# Patient Record
Sex: Male | Born: 1980 | Race: White | Hispanic: No | Marital: Married | Smoking: Current some day smoker
Health system: Southern US, Community
[De-identification: ages and names within clinical notes are randomized; demographics above are authoritative.]

## PROBLEM LIST (undated history)

## (undated) DIAGNOSIS — N289 Disorder of kidney and ureter, unspecified: Secondary | ICD-10-CM

## (undated) DIAGNOSIS — Q249 Congenital malformation of heart, unspecified: Secondary | ICD-10-CM

## (undated) DIAGNOSIS — G4730 Sleep apnea, unspecified: Secondary | ICD-10-CM

## (undated) HISTORY — PX: NO PAST SURGERIES: SHX2092

---

## 2008-04-21 ENCOUNTER — Emergency Department: Payer: Self-pay | Admitting: Emergency Medicine

## 2021-09-22 ENCOUNTER — Other Ambulatory Visit: Payer: Self-pay | Admitting: Family Medicine

## 2021-09-22 DIAGNOSIS — R1011 Right upper quadrant pain: Secondary | ICD-10-CM

## 2021-09-22 DIAGNOSIS — R1013 Epigastric pain: Secondary | ICD-10-CM

## 2021-10-01 ENCOUNTER — Ambulatory Visit
Admission: RE | Admit: 2021-10-01 | Discharge: 2021-10-01 | Disposition: A | Discharge status: Home / Self Care | Payer: 59 | Source: Ambulatory Visit | Attending: Family Medicine | Admitting: Family Medicine

## 2021-10-01 DIAGNOSIS — R1011 Right upper quadrant pain: Secondary | ICD-10-CM | POA: Insufficient documentation

## 2021-10-01 DIAGNOSIS — R1013 Epigastric pain: Secondary | ICD-10-CM | POA: Diagnosis present

## 2021-11-26 ENCOUNTER — Other Ambulatory Visit: Payer: Self-pay | Admitting: Gastroenterology

## 2021-11-26 DIAGNOSIS — R110 Nausea: Secondary | ICD-10-CM

## 2021-11-26 DIAGNOSIS — R1013 Epigastric pain: Secondary | ICD-10-CM

## 2021-12-03 ENCOUNTER — Ambulatory Visit
Admission: RE | Admit: 2021-12-03 | Discharge: 2021-12-03 | Disposition: A | Discharge status: Home / Self Care | Payer: 59 | Source: Ambulatory Visit | Attending: Gastroenterology | Admitting: Gastroenterology

## 2021-12-03 ENCOUNTER — Other Ambulatory Visit: Payer: Self-pay | Admitting: Gastroenterology

## 2021-12-03 DIAGNOSIS — R1013 Epigastric pain: Secondary | ICD-10-CM

## 2021-12-03 DIAGNOSIS — R110 Nausea: Secondary | ICD-10-CM | POA: Insufficient documentation

## 2022-11-17 ENCOUNTER — Emergency Department (HOSPITAL_COMMUNITY): Payer: 59

## 2022-11-17 ENCOUNTER — Other Ambulatory Visit: Payer: Self-pay

## 2022-11-17 ENCOUNTER — Emergency Department (HOSPITAL_COMMUNITY)
Admission: EM | Admit: 2022-11-17 | Discharge: 2022-11-18 | Disposition: A | Discharge status: Home / Self Care | Payer: 59 | Attending: Emergency Medicine | Admitting: Emergency Medicine

## 2022-11-17 ENCOUNTER — Telehealth: Payer: 59 | Admitting: Physician Assistant

## 2022-11-17 ENCOUNTER — Encounter (HOSPITAL_COMMUNITY): Payer: Self-pay

## 2022-11-17 DIAGNOSIS — N50812 Left testicular pain: Secondary | ICD-10-CM | POA: Diagnosis present

## 2022-11-17 DIAGNOSIS — R109 Unspecified abdominal pain: Secondary | ICD-10-CM | POA: Insufficient documentation

## 2022-11-17 DIAGNOSIS — Z1152 Encounter for screening for COVID-19: Secondary | ICD-10-CM | POA: Diagnosis not present

## 2022-11-17 DIAGNOSIS — N451 Epididymitis: Secondary | ICD-10-CM

## 2022-11-17 DIAGNOSIS — N12 Tubulo-interstitial nephritis, not specified as acute or chronic: Secondary | ICD-10-CM

## 2022-11-17 HISTORY — DX: Disorder of kidney and ureter, unspecified: N28.9

## 2022-11-17 HISTORY — DX: Congenital malformation of heart, unspecified: Q24.9

## 2022-11-17 HISTORY — DX: Sleep apnea, unspecified: G47.30

## 2022-11-17 NOTE — ED Triage Notes (Signed)
Having ongoing bilateral testicular pain since January worsening over the past week. Saw PCP yesterday. Dx with epididymitis. Treated yesterday with 1 gm Rocephin IM and placed on oral Doxycycline. No improvement today. Started having left sided back/flank pain this afternoon.  Denies fever and some burning with urination.

## 2022-11-17 NOTE — Progress Notes (Signed)
Based on what you shared with me, I feel your condition warrants further evaluation as soon as possible at an Emergency department.  I am very concerned that infection has tracked up your urinary tract to the kidney which needs immediate assessment and urine culture to make sure appropriate alternative antibiotics are given. I would recommend being evaluated ASAP this evening, especially giving your history of having one kidney.    NOTE: There will be NO CHARGE for this eVisit   If you are having a true medical emergency please call 911.      Emergency [facility]  Get Driving Directions  [id number]  [address]  [place], [place] [address]  Open 24/7/365      [facility] Emergency Department at [place]  [id number]  [place], [place] [address]  Open 24/7/365    Emergency [facility]  Get Driving Directions  [id number]  [address], [place] [address]  Open 24/7/365      [facility]  Get Driving Directions  [id number]  [address]  [place], [place] [address]  Open 24/7/365    [facility]  Emergency [place]  Get Driving Directions  [id number]  [place], [place] [address]  Open 24/7/365    [place]  Get Driving Directions  [id number]  [place], [place] [address]  Open 24/7/365    [facility]  Emergency [facility]  Get Driving Directions  [id number]  [address]  [place], [place] [address]  Open 24/7/365

## 2022-11-17 NOTE — ED Provider Triage Note (Signed)
Emergency Medicine Provider Triage Evaluation Note  Wayne Dominguez , a 42 y.o. male  was evaluated in triage.  Pt complains of ongoing bilateral testicular pain worse on the left and swelling for the last month that has been worsening over the past week.  He saw his PCP for this yesterday, was diagnosed with epididymitis, was given 1 g of Rocephin IM, and was discharged home on p.o. doxycycline.  Today he has began having left flank pain that waxes and wanes.  He denies fever, chills, nausea, vomiting, dysuria, hematuria, penile discharge, concern for STIs.  States that he is on [place] secondary to history of congenital solitary kidney and is followed by nephrologist but has always had stable kidney function.  He has not yet had an outpatient ultrasound or CT to evaluate his symptoms.   Review of Systems  Positive: See HPI Negative: See HPI  Physical Exam  BP (!) 137/100 (BP Location: Right Arm)   Pulse 82   Temp 97.9 F (36.6 C) (Oral)   Resp 17   Ht 6' 4"$  (1.93 m)   Wt 102.1 kg   SpO2 100%   BMI 27.39 kg/m  Gen:   Awake, no distress   Resp:  Normal effort  MSK:   Moves extremities without difficulty  Other:  Abdomen soft and nontender without rebound or guarding, mild left CVA tenderness, no right CVA tenderness, GU exam deferred in triage due to lack of chaperone availability  Medical Decision Making  Medically screening exam initiated at 8:41 PM.  Appropriate orders placed.  Fontaine No was informed that the remainder of the evaluation will be completed by another provider, this initial triage assessment does not replace that evaluation, and the importance of remaining in the ED until their evaluation is complete.     Suzzette Righter, PA-C 11/18/22 1506

## 2022-11-17 NOTE — ED Provider Notes (Signed)
[facility] Provider Note   CSN: [id number] Arrival date & time: 11/17/22  1956     History {Add pertinent medical, surgical, social history, OB history to HPI:1} Chief Complaint  Patient presents with   Testicle Pain   Flank Pain    Wayne Dominguez is a 42 y.o. male.  Patient with single kidney presenting with intermittent testicular pain for the past 1 month.  States the pain is in both testicles with mostly on the left side coming and going lasting for several minutes to hours at a time.  Not having any pain currently.  No pain with urination or blood in the urine.  No discharge from his penis.  No fever, nausea or vomiting.  Developed left-sided flank pain over the past 2 days that has since improved.  He went to his doctor yesterday and was started on doxycycline and Rocephin for suspected epididymitis and had an ultrasound scheduled.  Comes in tonight because of worsening pain as well as left flank pain was told to come to the hospital. Denies any flank pain or testicular pain currently.  No rash or penile discharge.  No fever.  Still taking doxycycline for suspected epididymitis.  No abdominal pain, chest pain or shortness of breath.  The history is provided by the patient.  Testicle Pain Pertinent negatives include no abdominal pain, no headaches and no shortness of breath.  Flank Pain Pertinent negatives include no abdominal pain, no headaches and no shortness of breath.       Home Medications Prior to Admission medications   Not on File      Allergies    Patient has no allergy information on record.    Review of Systems   Review of Systems  Constitutional:  Negative for activity change, appetite change and fever.  HENT:  Negative for congestion and rhinorrhea.   Respiratory:  Negative for cough, chest tightness and shortness of breath.   Gastrointestinal:  Negative for abdominal pain, nausea and vomiting.   Genitourinary:  Positive for flank pain and testicular pain. Negative for dysuria, hematuria and urgency.  Musculoskeletal:  Positive for back pain.  Skin:  Negative for rash.  Neurological:  Negative for dizziness, weakness and headaches.    all other systems are negative except as noted in the HPI and PMH.   Physical Exam Updated Vital Signs BP (!) 137/100 (BP Location: Right Arm)   Pulse 82   Temp 97.9 F (36.6 C) (Oral)   Resp 17   Ht 6' 4"$  (1.93 m)   Wt 102.1 kg   SpO2 100%   BMI 27.39 kg/m  Physical Exam Vitals and nursing note reviewed.  Constitutional:      General: He is not in acute distress.    Appearance: He is well-developed.  HENT:     Head: Normocephalic and atraumatic.     Mouth/Throat:     Pharynx: No oropharyngeal exudate.  Eyes:     Conjunctiva/sclera: Conjunctivae normal.     Pupils: Pupils are equal, round, and reactive to light.  Neck:     Comments: No meningismus. Cardiovascular:     Rate and Rhythm: Normal rate and regular rhythm.     Heart sounds: Normal heart sounds. No murmur heard. Pulmonary:     Effort: Pulmonary effort is normal. No respiratory distress.     Breath sounds: Normal breath sounds.  Abdominal:     Palpations: Abdomen is soft.     Tenderness: There  is no abdominal tenderness. There is no guarding or rebound.  Genitourinary:    Comments: Testicles normal to inspection.  No erythema or asymmetry.  No tenderness to palpation.  No penile discharge. Musculoskeletal:        General: No tenderness. Normal range of motion.     Cervical back: Normal range of motion and neck supple.     Comments: No CVAT  Skin:    General: Skin is warm.  Neurological:     Mental Status: He is alert and oriented to person, place, and time.     Cranial Nerves: No cranial nerve deficit.     Motor: No abnormal muscle tone.     Coordination: Coordination normal.     Comments: No ataxia on finger to nose bilaterally. No pronator drift. 5/5 strength  throughout. CN 2-12 intact.Equal grip strength. Sensation intact.   Psychiatric:        Behavior: Behavior normal.     ED Results / Procedures / Treatments   Labs (all labs ordered are listed, but only abnormal results are displayed) Labs Reviewed  RESP PANEL BY RT-PCR (RSV, FLU A&B, COVID)  RVPGX2  CBC WITH DIFFERENTIAL/PLATELET  COMPREHENSIVE METABOLIC PANEL  LIPASE, BLOOD  URINALYSIS, ROUTINE W REFLEX MICROSCOPIC    EKG None  Radiology CT ABDOMEN PELVIS WO CONTRAST  Result Date: 11/17/2022 CLINICAL DATA:  Left flank pain. EXAM: CT ABDOMEN AND PELVIS WITHOUT CONTRAST TECHNIQUE: Multidetector CT imaging of the abdomen and pelvis was performed following the standard protocol without IV contrast. RADIATION DOSE REDUCTION: This exam was performed according to the departmental dose-optimization program which includes automated exposure control, adjustment of the mA and/or kV according to patient size and/or use of iterative reconstruction technique. COMPARISON:  None Available. FINDINGS: Lower chest: No acute abnormality. Hepatobiliary: No focal liver abnormality is seen. No gallstones, gallbladder wall thickening, or biliary dilatation. Pancreas: Unremarkable. No pancreatic ductal dilatation or surrounding inflammatory changes. Spleen: Normal in size without focal abnormality. Adrenals/Urinary Tract: Adrenal glands are unremarkable. The right kidney is absent. The left kidney is normal in size, without renal calculi or hydronephrosis. A 12 mm diameter cyst is seen within the upper pole of the left kidney. Bladder is unremarkable. Stomach/Bowel: Stomach is within normal limits. Appendix appears normal. Stool is seen throughout the large bowel. No evidence of bowel wall thickening, distention, or inflammatory changes. Vascular/Lymphatic: No significant vascular findings are present. No enlarged abdominal or pelvic lymph nodes. Reproductive: Prostate is unremarkable. Other: No abdominal wall hernia  or abnormality. No abdominopelvic ascites. Musculoskeletal: No acute or significant osseous findings. IMPRESSION: 1. Absent right kidney. 2. 12 mm diameter left renal cyst. No follow-up imaging is recommended. This recommendation follows ACR consensus guidelines: Management of the Incidental Renal Mass on CT: A White Paper of the ACR Incidental Findings Committee. J Am Coll Radiol [phone]. Electronically Signed   By: Virgina Norfolk M.D.   On: 11/17/2022 22:21   US SCROTUM W/DOPPLER  Result Date: 11/17/2022 CLINICAL DATA:  Left greater than right testicle pain EXAM: SCROTAL ULTRASOUND DOPPLER ULTRASOUND OF THE TESTICLES TECHNIQUE: Complete ultrasound examination of the testicles, epididymis, and other scrotal structures was performed. Color and spectral Doppler ultrasound were also utilized to evaluate blood flow to the testicles. COMPARISON:  None Available. FINDINGS: Right testicle Measurements: 4.5 x 2.2 x 2.8 cm. No mass or microlithiasis visualized. Left testicle Measurements: 4.2 x 2.4 x 3.2 cm. No mass or microlithiasis visualized. Right epididymis: Small cysts measuring up to 4 mm, question mild tubular  ectasia Left epididymis: Small cysts measuring up to 4 mm, question mild tubular ectasia Hydrocele:  None visualized. Varicocele:  Small left greater than right varicoceles Pulsed Doppler interrogation of both testes demonstrates normal low resistance arterial and venous waveforms bilaterally. IMPRESSION: 1. Negative for testicular torsion or intratesticular mass. 2. Small left greater than right varicoceles. Electronically Signed   By: Donavan Foil M.D.   On: 11/17/2022 21:33    Procedures Procedures  {Document cardiac monitor, telemetry assessment procedure when appropriate:1}  Medications Ordered in ED Medications - No data to display  ED Course/ Medical Decision Making/ A&P   {   Click here for ABCD2, HEART and other calculatorsREFRESH Note before signing :1}                           Medical Decision Making Amount and/or Complexity of Data Reviewed Labs: ordered. Decision-making details documented in ED Course. Radiology: ordered and independent interpretation performed. Decision-making details documented in ED Course. ECG/medicine tests: ordered and independent interpretation performed. Decision-making details documented in ED Course.   Testicle pain for the past 1 month.  No fever.  Stable vital signs.  Abdomen soft without peritoneal signs.  Ultrasound dated in triage is negative for torsion.  Does show varicoceles bilaterally.  CT obtained in triage is negative for obstructive uropathy or kidney stone.   Will check labs, treat symptoms.  He declines pain and nausea medication currently.  {Document critical care time when appropriate:1} {Document review of labs and clinical decision tools ie heart score, Chads2Vasc2 etc:1}  {Document your independent review of radiology images, and any outside records:1} {Document your discussion with family members, caretakers, and with consultants:1} {Document social determinants of health affecting pt's care:1} {Document your decision making why or why not admission, treatments were needed:1} Final Clinical Impression(s) / ED Diagnoses Final diagnoses:  None    Rx / DC Orders ED Discharge Orders     None

## 2022-11-18 LAB — COMPREHENSIVE METABOLIC PANEL
ALT: 39 U/L (ref 0–44)
AST: 38 U/L (ref 15–41)
Albumin: 4.2 g/dL (ref 3.5–5.0)
Alkaline Phosphatase: 54 U/L (ref 38–126)
Anion gap: 9 (ref 5–15)
BUN: 20 mg/dL (ref 6–20)
CO2: 27 mmol/L (ref 22–32)
Calcium: 9.2 mg/dL (ref 8.9–10.3)
Chloride: 104 mmol/L (ref 98–111)
Creatinine, Ser: 1.41 mg/dL — ABNORMAL HIGH (ref 0.61–1.24)
GFR, Estimated: >60 mL/min (ref >60)
Glucose, Bld: 111 mg/dL — ABNORMAL HIGH (ref 70–99)
Potassium: 3.9 mmol/L (ref 3.5–5.1)
Sodium: 140 mmol/L (ref 135–145)
Total Bilirubin: 0.7 mg/dL (ref 0.3–1.2)
Total Protein: 6.5 g/dL (ref 6.5–8.1)

## 2022-11-18 LAB — CBC WITH DIFFERENTIAL/PLATELET
Abs Immature Granulocytes: 0.01 10*3/uL (ref 0.00–0.07)
Basophils Absolute: 0 10*3/uL (ref 0.0–0.1)
Basophils Relative: 1 %
Eosinophils Absolute: 0.1 10*3/uL (ref 0.0–0.5)
Eosinophils Relative: 1 %
HCT: 48.8 % (ref 39.0–52.0)
Hemoglobin: 16.3 g/dL (ref 13.0–17.0)
Immature Granulocytes: 0 %
Lymphocytes Relative: 29 %
Lymphs Abs: 1.5 10*3/uL (ref 0.7–4.0)
MCH: 31.7 pg (ref 26.0–34.0)
MCHC: 33.4 g/dL (ref 30.0–36.0)
MCV: 94.9 fL (ref 80.0–100.0)
Monocytes Absolute: 0.5 10*3/uL (ref 0.1–1.0)
Monocytes Relative: 10 %
Neutro Abs: 3 10*3/uL (ref 1.7–7.7)
Neutrophils Relative %: 59 %
Platelets: 155 10*3/uL (ref 150–400)
RBC: 5.14 MIL/uL (ref 4.22–5.81)
RDW: 12.8 % (ref 11.5–15.5)
WBC: 5.1 10*3/uL (ref 4.0–10.5)
nRBC: 0 % (ref 0.0–0.2)

## 2022-11-18 LAB — URINALYSIS, ROUTINE W REFLEX MICROSCOPIC
Bacteria, UA: NONE SEEN
Bilirubin Urine: NEGATIVE
Glucose, UA: >=500 mg/dL — AB
Hgb urine dipstick: NEGATIVE
Ketones, ur: NEGATIVE mg/dL
Leukocytes,Ua: NEGATIVE
Nitrite: NEGATIVE
Protein, ur: NEGATIVE mg/dL
Specific Gravity, Urine: 1.02 (ref 1.005–1.030)
pH: 6 (ref 5.0–8.0)

## 2022-11-18 LAB — RESP PANEL BY RT-PCR (RSV, FLU A&B, COVID)  RVPGX2
Influenza A by PCR: NEGATIVE
Influenza B by PCR: NEGATIVE
Resp Syncytial Virus by PCR: NEGATIVE
SARS Coronavirus 2 by RT PCR: NEGATIVE

## 2022-11-18 LAB — LIPASE, BLOOD: Lipase: 46 U/L (ref 11–51)

## 2022-11-18 MED ORDER — ACETAMINOPHEN 325 MG PO TABS
650.0000 mg | ORAL_TABLET | Freq: Once | ORAL | Status: AC
  Administered 2022-11-18: 650 mg via ORAL
  Filled 2022-11-18: qty 2

## 2022-11-18 NOTE — Discharge Instructions (Signed)
Use Tylenol as needed for pain as well as snug fitting underwear.  Continue your antibiotics.  Follow-up with the urologist.  Return to the ED with worsening pain, fever, vomiting, able to urinate or any other concerns.

## 2022-12-02 ENCOUNTER — Encounter: Payer: Self-pay | Admitting: Urology

## 2022-12-02 ENCOUNTER — Ambulatory Visit (INDEPENDENT_AMBULATORY_CARE_PROVIDER_SITE_OTHER): Payer: 59 | Admitting: Urology

## 2022-12-02 VITALS — BP 138/81 | HR 82 | Ht 76.0 in | Wt 220.0 lb

## 2022-12-02 DIAGNOSIS — R3915 Urgency of urination: Secondary | ICD-10-CM | POA: Diagnosis not present

## 2022-12-02 DIAGNOSIS — R3912 Poor urinary stream: Secondary | ICD-10-CM | POA: Diagnosis not present

## 2022-12-02 DIAGNOSIS — R3911 Hesitancy of micturition: Secondary | ICD-10-CM

## 2022-12-02 DIAGNOSIS — N5082 Scrotal pain: Secondary | ICD-10-CM

## 2022-12-02 DIAGNOSIS — R350 Frequency of micturition: Secondary | ICD-10-CM | POA: Diagnosis not present

## 2022-12-02 DIAGNOSIS — R399 Unspecified symptoms and signs involving the genitourinary system: Secondary | ICD-10-CM

## 2022-12-02 LAB — URINALYSIS, COMPLETE
Bilirubin, UA: NEGATIVE
Ketones, UA: NEGATIVE
Leukocytes,UA: NEGATIVE
Nitrite, UA: NEGATIVE
Protein,UA: NEGATIVE
RBC, UA: NEGATIVE
Specific Gravity, UA: <1.005 — ABNORMAL LOW (ref 1.005–1.030)
Urobilinogen, Ur: 0.2 mg/dL (ref 0.2–1.0)
pH, UA: 5.5 (ref 5.0–7.5)

## 2022-12-02 LAB — MICROSCOPIC EXAMINATION
Bacteria, UA: NONE SEEN
Epithelial Cells (non renal): NONE SEEN /hpf (ref 0–10)

## 2022-12-02 LAB — BLADDER SCAN AMB NON-IMAGING: Scan Result: 0

## 2022-12-02 MED ORDER — TAMSULOSIN HCL 0.4 MG PO CAPS: 0.4000 mg | ORAL_CAPSULE | Freq: Every day | ORAL | 1 refills | Status: AC

## 2022-12-02 NOTE — Progress Notes (Signed)
12/02/2022 12:16 PM   Wayne Dominguez Dec 14, 1980 [id number]  Referring provider: [place], [place],  [place] [address]  Chief Complaint  Patient presents with   New Patient (Initial Visit)    HPI: Wayne Dominguez is a 42 y.o. male referred for evaluation of scrotal pain.  2-3 year history of intermittent lower urinary tract symptoms consisting of urinary hesitancy, decreased force and caliber of his urinary stream, frequency and urgency IPSS 12/35 Since early January 2024 complains of intermittent scrotal pain L >R.  Occasionally notes pain in the left hemiscrotum after voiding No dysuria or gross hematuria Has congenital absence of the right kidney and developed left flank pain/scrotal pain 11/17/2022 and seen in the ED at [facility].  CT abdomen/pelvis without contrast performed which showed absent right kidney and no other abnormalities. 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   Renal disorder    Sleep apnea     Surgical History: Past Surgical History:  Procedure Laterality Date   NO PAST SURGERIES      Home Medications:  Allergies as of 12/02/2022   No Known Allergies      Medication List        Accurate as of December 02, 2022 12:16 PM. If you have any questions, ask your nurse or doctor.          buPROPion 300 MG 24 hr tablet Commonly known as: WELLBUTRIN XL Take 300 mg by mouth daily.   Farxiga 5 MG Tabs tablet Generic drug: dapagliflozin propanediol Take 5 mg by mouth daily.   multivitamin with minerals Tabs tablet Take 1 tablet by mouth daily.   rosuvastatin 20 MG tablet Commonly known as: CRESTOR Take 20 mg by mouth daily.   tamsulosin 0.4 MG Caps capsule Commonly known as: FLOMAX Take 1 capsule (0.4 mg total) by mouth  daily. Started by: Abbie Sons, MD        Allergies: No Known Allergies  Family History: History reviewed. No pertinent family history.  Social History:  reports that he has been smoking cigarettes and cigars. He has quit using smokeless tobacco. He reports current alcohol use. He reports that he does not currently use drugs.   Physical Exam: BP 138/81   Pulse 82   Ht '6\' 4"'$  (1.93 m)   Wt 220 lb (99.8 kg)   BMI 26.78 kg/m   Constitutional:  Alert and oriented, No acute distress. HEENT: [place] AT Respiratory: Normal respiratory effort, no increased work of breathing. GU: Phallus without lesions.  Testes descended bilaterally without masses or tenderness.  Spermatic cord/epididymis palpably normal bilaterally.  Prostate 30 g, moderately tender.  Increased pelvic floor tenderness Skin: No rashes, bruises or suspicious lesions. Neurologic: Grossly intact, no focal deficits, moving all 4 extremities. Psychiatric: Normal mood and affect.  Laboratory Data:  Urinalysis Dipstick/microscopy negative  Pertinent Imaging: CT/ultrasound images were personally reviewed and interpreted  Assessment & Plan:    1.  Lower urinary tract symptoms Moderate LUTS.  We discussed potential etiologies including increased bladder neck tone and chronic prostatitis/chronic pelvic pain syndrome IPSS today 0 mL Trial tamsulosin 0.4 mg daily 6 week follow-up with symptom reassessment, IPSS  2. Scrotal pain Normal exam and negative scrotal ultrasound We discussed possible etiologies including referred pain from chronic prostatitis/chronic pelvic pain  syndrome Reassess response to tamsulosin on follow-up PT referral for pelvic floor physical therapy for persistent symptoms   Abbie Sons, MD  [facility] [address] [place], [place] [address] [phone]

## 2023-01-13 ENCOUNTER — Ambulatory Visit: Payer: Self-pay | Admitting: Urology

## 2023-05-29 IMAGING — RF DG UGI W/ HIGH DENSITY W/O KUB
13 of 17 series · 14 of 24 positions shown · non-contrast
Comparison: Abdominal ultrasound 10/01/2021.

CLINICAL DATA: Abdominal pain, epigastric. Nausea. Dyspepsia.
Additional history provided: Patient reports upper abdominal pain
(left-sided) since August 2021. Emesis and diarrhea. Difficulty
speaking at times. History of hiatal hernia. Prior endoscopy.

EXAM:
UPPER GI SERIES WITH HIGH DENSITY WITHOUT KUB
TECHNIQUE: Combined double and single contrast examination was performed using
effervescent crystals, high-density barium and thin liquid barium.
This exam was performed by Thrasher, Christene and was
supervised and interpreted by Dr. Jong Hak Desmarais.
FLUOROSCOPY:
Radiation Exposure Index (as provided by the fluoroscopic device):
268.70 mGy Kerma (7 minutes, 36 seconds)

[Series 1: cp_standard · 0.17mm/px · 1 of 77 frames shown (1 of 8)]
[frame 12/77]
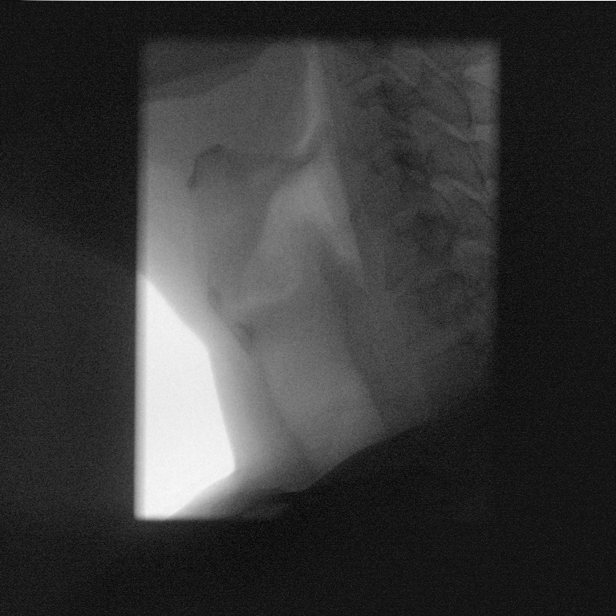

[Series 2: cp_standard · 0.17mm/px · 1 of 84 frames shown (2 of 8)]
[frame 22/84]
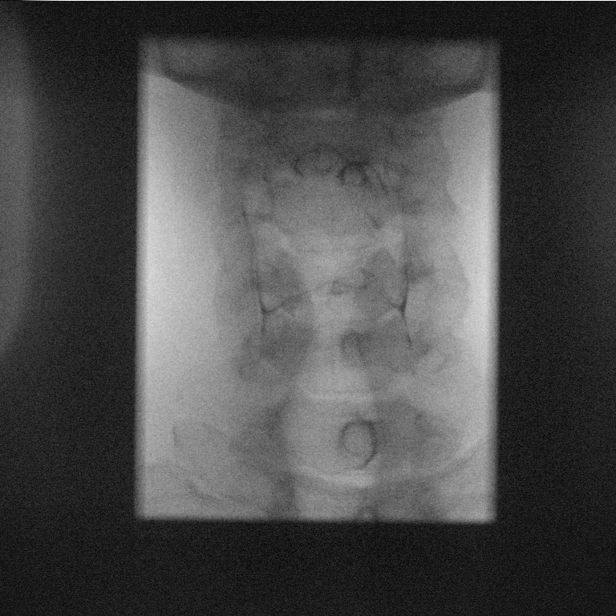

[Series 3: cp_standard · 0.17mm/px · 1 of 97 frames shown (3 of 8)]
[frame 49/97]
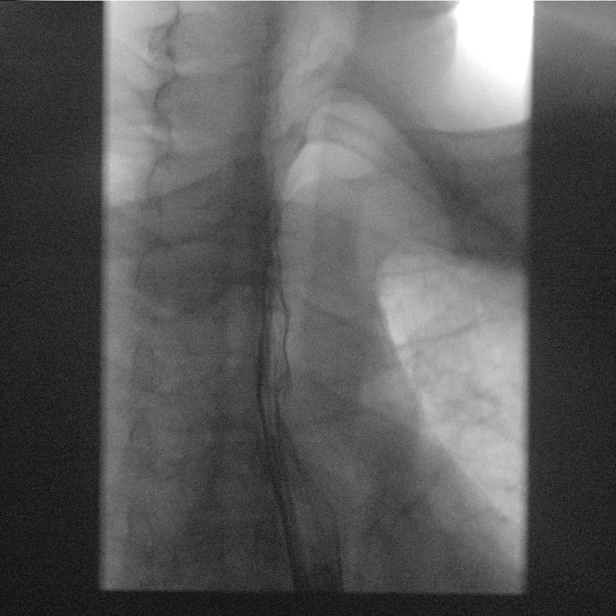

[Series 6: fluoro_barium 2fps_bw · 0.17mm/px · 1 of 4 frames shown (1 of 5)]
[frame 3/4]
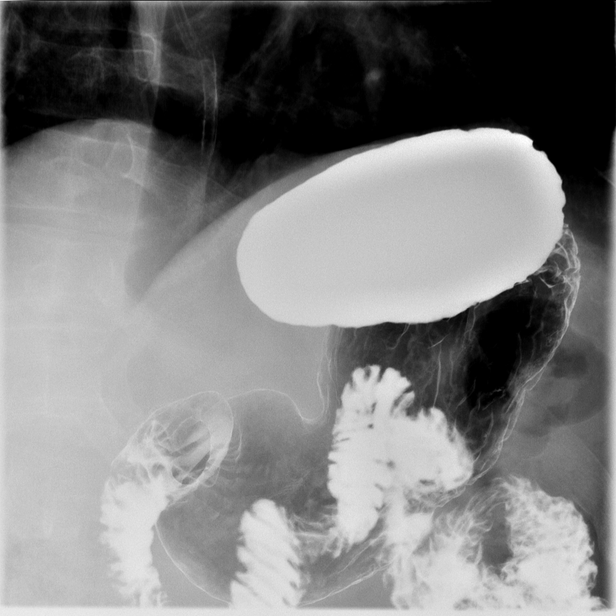

[Series 7: cp_standard · 0.17mm/px · 1 of 1 slices shown (4 of 8)]
[im 1/1]
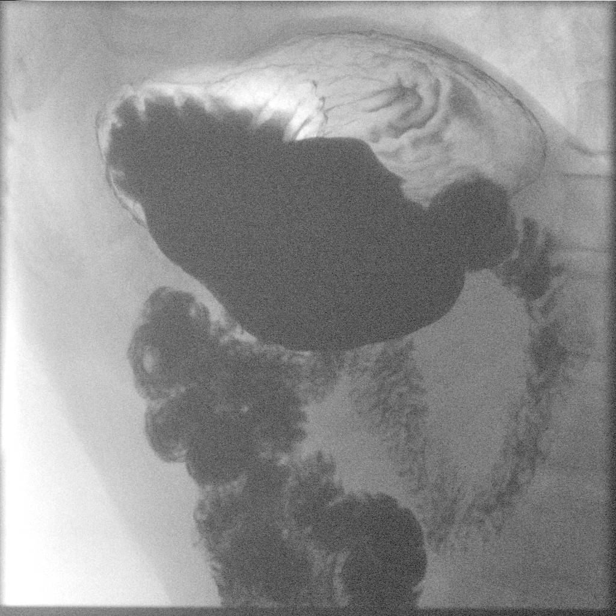

[Series 10: fluoro_barium 2fps_bw · 0.17mm/px · 1 of 3 frames shown (2 of 5)]
[frame 2/3]
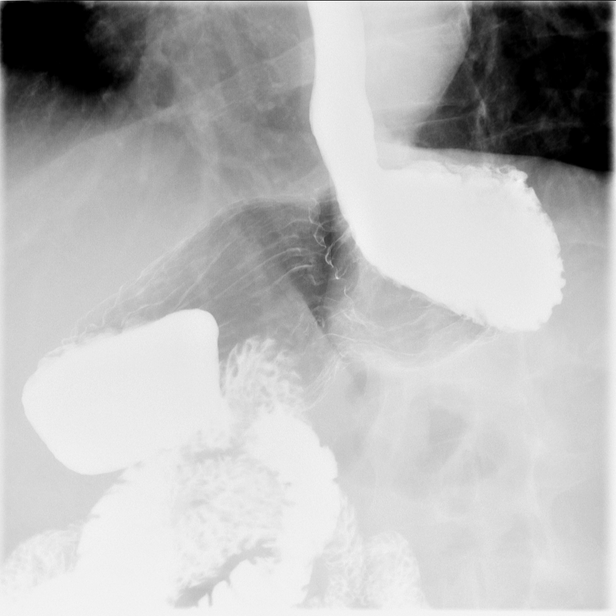

[Series 11: cp_standard · 0.26mm/px · 1 of 189 frames shown (5 of 8)]
[frame 166/189]
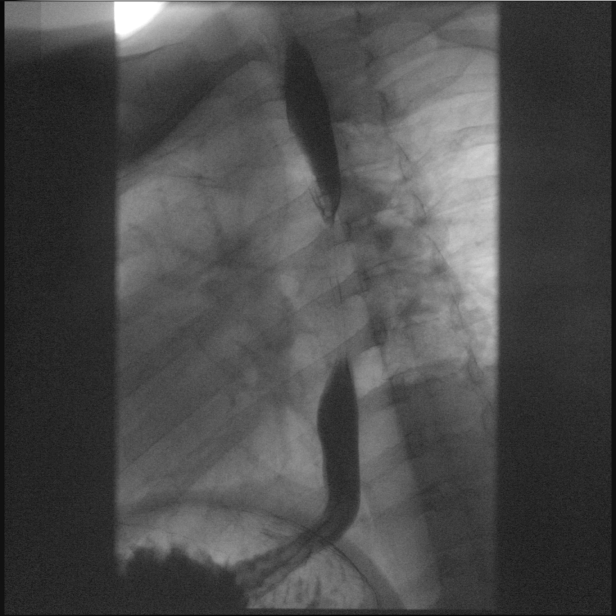

[Series 13: cp_standard · 0.26mm/px · 1 of 1 slices shown (6 of 8)]
[im 1/1]
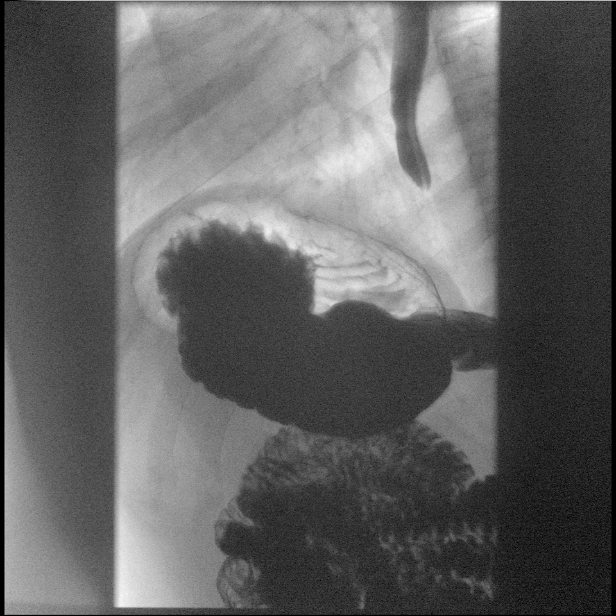

[Series 16: fluoro_barium 2fps_bw · 0.17mm/px · 1 of 4 frames shown (3 of 5)]
[frame 3/4]
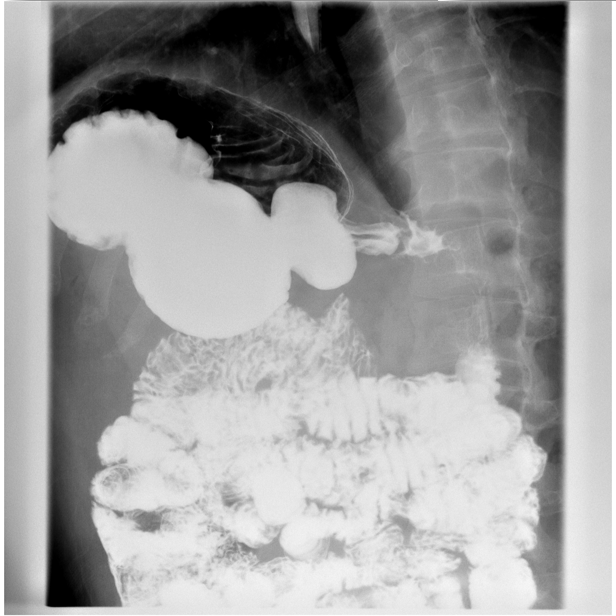

[Series 19: cp_standard · 0.25mm/px · 1 of 131 frames shown (7 of 8)]
[frame 31/131]
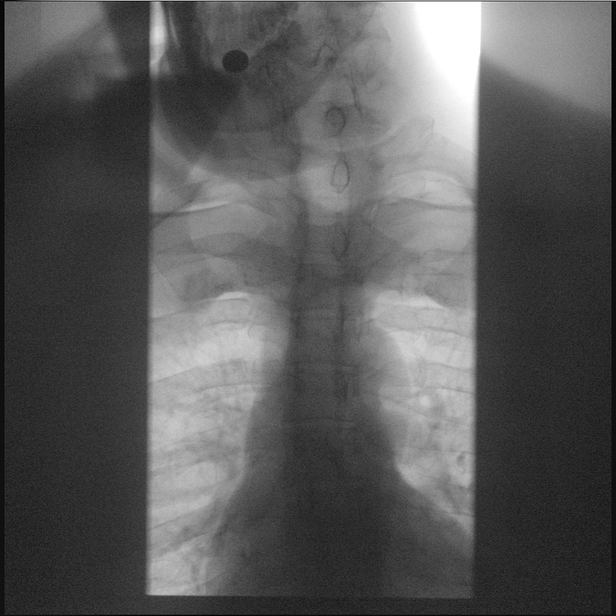

[Series 21: cp_standard · 0.17mm/px · 2 of 196 frames shown (8 of 8)]
[frame 99/196]
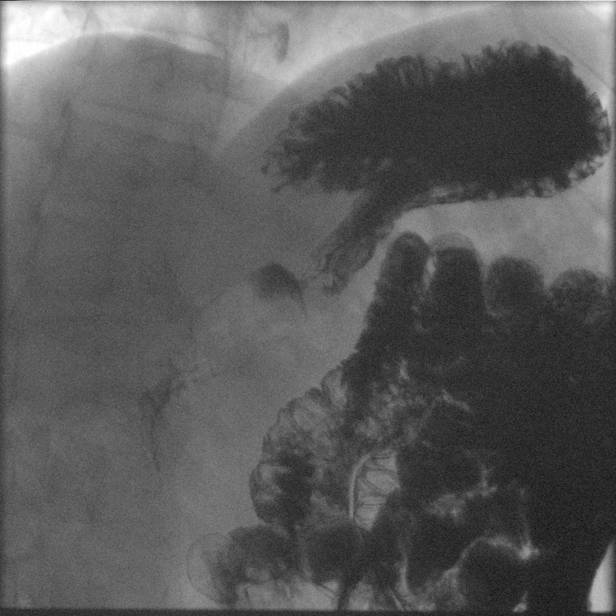
[frame 167/196]
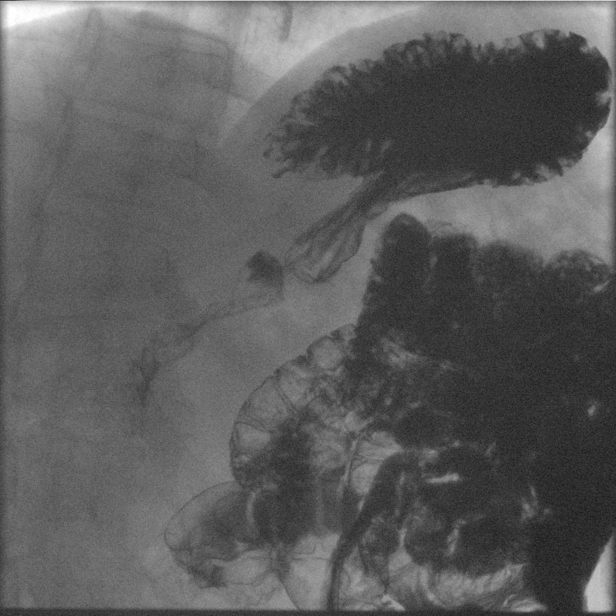

[Series 23: fluoro_barium 2fps_bw · 0.17mm/px · 1 of 3 frames shown (4 of 5)]
[frame 1/3]
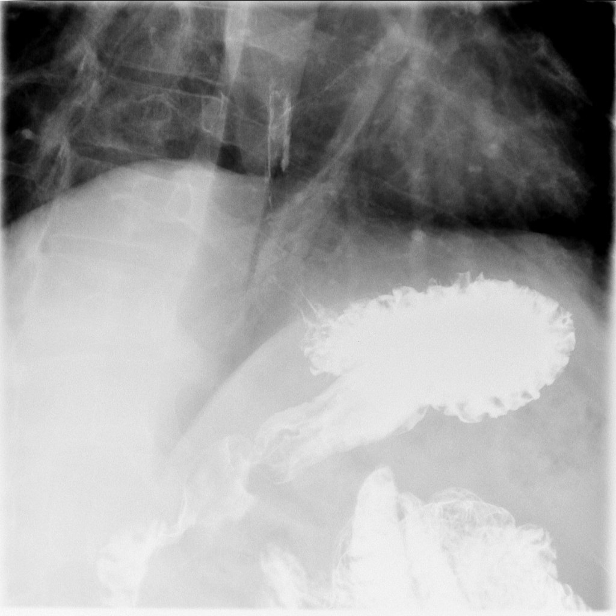

[Series 24: fluoro_barium 2fps_bw · 0.17mm/px · 1 of 3 frames shown (5 of 5)]
[frame 3/3]
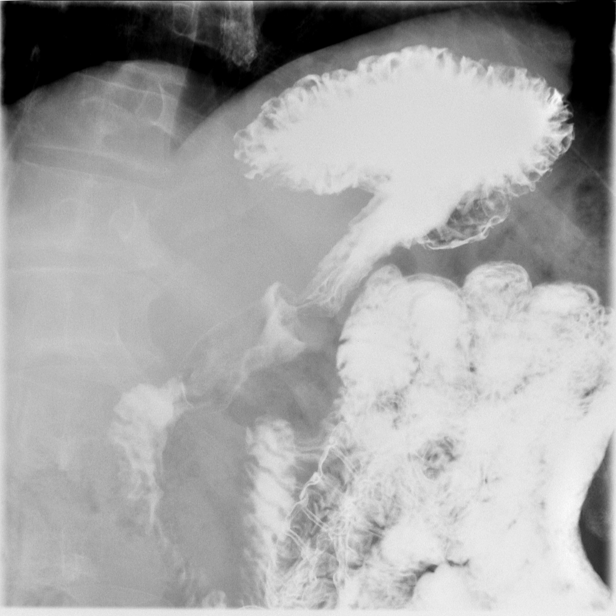

[14 of 24 positions shown; findings below may reference images not displayed]

FINDINGS: Fluoroscopic evaluation demonstrates normal caliber and smooth
contour of the esophagus. No evidence of fixed stricture, mass or
mucosal abnormality. Normal esophageal motility was observed. A tiny
sliding hiatal hernia is questioned. No gastroesophageal reflux was
observed. The patient swallowed a 13 mm barium tablet, which freely
passed into the stomach.

Normal appearance of the stomach, duodenal bulb and duodenal sweep.
No evidence of ulceration, fold thickening or mass.
IMPRESSION: A tiny sliding hiatal hernia is questioned.

Otherwise unremarkable upper GI series, as described.

## 2023-11-10 ENCOUNTER — Other Ambulatory Visit: Payer: Self-pay | Admitting: General Surgery

## 2023-11-10 DIAGNOSIS — R1013 Epigastric pain: Secondary | ICD-10-CM

## 2023-11-10 DIAGNOSIS — R7401 Elevation of levels of liver transaminase levels: Secondary | ICD-10-CM

## 2023-11-18 ENCOUNTER — Other Ambulatory Visit: Payer: 59
